# Patient Record
Sex: Female | Born: 1995 | Race: White | Hispanic: No | Marital: Married | Smoking: Never smoker
Health system: Southern US, Community
[De-identification: ages and names within clinical notes are randomized; demographics above are authoritative.]

---

## 2018-02-25 ENCOUNTER — Encounter: Payer: Self-pay | Admitting: Family Medicine

## 2018-02-25 ENCOUNTER — Ambulatory Visit
Admission: RE | Admit: 2018-02-25 | Discharge: 2018-02-25 | Disposition: A | Discharge status: Home / Self Care | Payer: No Typology Code available for payment source | Source: Ambulatory Visit | Attending: Family Medicine | Admitting: Family Medicine

## 2018-02-25 ENCOUNTER — Ambulatory Visit: Payer: Self-pay | Admitting: Family Medicine

## 2018-02-25 ENCOUNTER — Other Ambulatory Visit: Payer: Self-pay

## 2018-02-25 VITALS — BP 110/62 | HR 82 | Temp 98.3°F | Ht 69.0 in | Wt 110.8 lb

## 2018-02-25 DIAGNOSIS — Y9315 Activity, underwater diving and snorkeling: Secondary | ICD-10-CM

## 2018-02-25 DIAGNOSIS — Z0000 Encounter for general adult medical examination without abnormal findings: Secondary | ICD-10-CM

## 2018-02-25 LAB — POCT HEMOGLOBIN: Hemoglobin: 13.1 g/dL (ref 12.2–16.2)

## 2018-02-25 LAB — POCT UA - GLUCOSE/PROTEIN
Glucose, UA: NEGATIVE
Protein, UA: NEGATIVE

## 2018-02-25 LAB — GLUCOSE, POCT (MANUAL RESULT ENTRY): POC Glucose: 84 mg/dl (ref 70–99)

## 2018-02-25 NOTE — Progress Notes (Signed)
S:    Patient arrives in good spirits without complaints.    Presents for lung function evaluation for "dive physical". Patient reports breathing has been stable and without complaint.   Denies history or breathing difficulty.   O: Patient provided good effort while attempting spirometry.  FVC 4.87    Calculated Lower Limit for NOAA Diving Standards   FVC = 3.62 FEV1 3.96       Calculated Lower Limit for NOAA Diving Standards   FEV1= 3.09 FEV1/FVC  81.3   Calculated Lower Limit for NOAA Diving Standards   FEV1/FVC = 75.9   See "scanned report" or Documentation Flowsheet (discrete results - PFTs) for  Spirometry results and copy of evaluation.   A/P: Spirometry evaluation without bronchodilator reveals normal lung function.  FEV1, FVC and FEV1/FVC ratio all exceed threshold for spirometric parameters.   Reviewed results of pulmonary function tests.  Pt verbalized understanding of results and education.

## 2018-02-25 NOTE — Assessment & Plan Note (Signed)
Vision (distance, near, color), hearing, UA, CBG, Hgb, and Spirometry all within normal limits. Normal CXR obtained today.  EKG:  N/a due to age Coronary assessment:  N/a due to age Approval for SCUBA diving, I find no medical conditions considered incompatible with diving. Due to age and lack of medical conditions, Faith SagoSarah qualifies for 2 year certification.

## 2018-02-25 NOTE — Patient Instructions (Signed)
It was good to meet you today.  Go for your chest xray.    By the time you get back here, it will have been read and you can pick up your paperwork up front if everything looks good.

## 2018-02-25 NOTE — Progress Notes (Signed)
Subjective:    Faith AhmadiSarah Segall is a 22 y.o. female who presents to [facility] today for scuba diving physical for the [facility]:  1.  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  Denies any complications or injuries while diving.  Has never failed a fitness to dive physical.  Currently well, without complaints.  No injuries/issues during any prior dives.  The following portions of the patient's history were reviewed and updated as appropriate: allergies, current medications, past medical history, family and social history, and problem list.  PMH reviewed.  No past medical history on file.  Medications reviewed. No current outpatient medications on file.   No current facility-administered medications for this visit.     PMH:   - No medical conditions. - No other hospitalizations or other prior medical history   PSH: - no history of surgeries in past.   Family History: - Family history of diabetes and high cholesterol; otherwise denies.  No pulmonary issues.    Social: - From [place] .  Moved here to [place] for work [employer] - Never smoker - Denies illicit drug use - Very occasional social drinker (1-2 drinks)  SCUBA ROS:  She denies any history of middle ear trauma/disease, vertigo, ocular surgery, asthma or other respiratory issues, seizures, loss of consciousness, recurring neurologic disorders, history of head injury, coagulopathies, evidence of CAD or other structural heart disease, pneumothorax, diabetes, or exercise intolerance.    General ROS:  The patient also denies fever, unusual weight change, decreased hearing, chest pain, palpitations, pre-syncopal or syncopal episodes, dyspnea on exertion, prolonged cough, hemoptysis, change in bowel habits, melena, hematochezia, severe indigestion/heartburn, nausea/vomiting/abdominal pain, genital sores, muscle weakness, difficulty walking, abnormal bleeding,  or enlarged lymph nodes.     Objective:   Physical Exam BP 110/62   Pulse 82   Temp 98.3 F (36.8 C) (Oral)   Ht 5\' 9"  (1.753 m)   Wt 110 lb 12.8 oz (50.3 kg)   LMP 02/10/2018   SpO2 98%   BMI 16.36 kg/m  Gen:  Alert, cooperative patient who appears stated age in no acute distress.  Vital signs reviewed. Head:  [place]/AT Eyes:  Fundoscopy WNL BL.  PERRL, EOMI Ears:  External ears WNL, Bilateral TM's normal without retraction, redness or bulging.  Canals clear BL  Mouth:  Good dental hygiene. Tonsils non-erythematous, non-edematous.   MMM Neck:  Trachea midline Cardiac:  Regular rate and rhythm without murmur auscultated.   Pulm:  Clear to auscultation bilaterally with good air movement throuhout.  No wheezes or rales noted.   Abd:  Soft/nondistended/nontender.  Good bowel sounds throughout all four quadrants.  No masses noted.  Exts: No edema BL LE's, warm and well-perfused Neuro:  Alert and oriented to person, place, and date.  CN II-XII intact.  Sensation intact to light touch and vibration bilateral upper and lower extremities equally.  Motor function equal and strength 5/5 bilateral upper and lower extremities.  Normal gait.  DTRs +2 BL tricep, brachialis, patellar, and achilles.  Finger to nose cerebellar testing within normal limits.  Color vision testing normal. Psych:  Not depressed or anxious appearing.  Linear and coherent thought process as evidenced by speech pattern. Smiles spontaneously.   No results found for this or any previous visit (from the past 72 hour(s)).

## 2018-09-21 ENCOUNTER — Encounter: Payer: Self-pay | Admitting: Physical Therapy

## 2018-09-21 ENCOUNTER — Other Ambulatory Visit: Payer: Self-pay

## 2018-09-21 ENCOUNTER — Ambulatory Visit: Payer: BLUE CROSS/BLUE SHIELD | Attending: Obstetrics and Gynecology | Admitting: Physical Therapy

## 2018-09-21 DIAGNOSIS — R278 Other lack of coordination: Secondary | ICD-10-CM | POA: Diagnosis present

## 2018-09-21 DIAGNOSIS — R252 Cramp and spasm: Secondary | ICD-10-CM | POA: Diagnosis not present

## 2018-09-21 NOTE — Patient Instructions (Signed)
Guided Meditation for Pelvic Floor Relaxation  [employer]  Pelvic Floor Release Stretches (NEW)  [employer]  Pelvic Floor Release Stretches  [employer]   The "Pelvic Drop" to Release Pelvic Floor Tension: Three Visualizations    Vaginismus.[facility] [address] [place] [address] [place], [place] [address] Phone # [phone] Fax [phone]

## 2018-09-21 NOTE — Therapy (Signed)
[facility] [address], [address] [place], [place], [address] Phone: [phone]   Fax:  [phone]  Physical Therapy Evaluation  Patient Details  Name: Faith AhmadiSarah Mendez MRN: [id number] Date of Birth: 1995/12/01 Referring Provider (PT): Dr. Evans LanceJacqueline Wong   Encounter Date: 09/21/2018  PT End of Session - 09/21/18 2232    Visit Number  1    Date for PT Re-Evaluation  01/20/19    PT Start Time  1625    PT Stop Time  1710    PT Time Calculation (min)  45 min    Activity Tolerance  Patient tolerated treatment well;No increased pain    Behavior During Therapy  [facility] for tasks assessed/performed       History reviewed. No pertinent past medical history.  History reviewed. No pertinent surgical history.  There were no vitals filed for this visit.   Subjective Assessment - 09/21/18 1628    Subjective  Patient reports pelvic pain with intercourse. Patietn reports the muscles are tight. Patient feels like the penis is hitting a wall. The pain will linger for several hours. Mentally prepares herself to use a tampon to relax the muscles.     Patient is accompained by:  Family member   spouse   Patient Stated Goals  Not have pain with intercourse    Currently in Pain?  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 Evans LanceJacqueline Wong    Prior Therapy  None      Precautions   Precautions  None      Restrictions   Weight  Bearing Restrictions  No      Balance Screen   Has the patient fallen in the past 6 months  No    Has the patient had a decrease in activity level because of a fear of falling?   No    Is the patient reluctant to leave their home because of a fear of falling?   No      Home Public house managernvironment   Living Environment  Private residence      Prior Function   Level of Independence  Independent    Vocation  Full time employment    Vocation Requirements  stand, lifting, climbing laters, lift 60# boxes, frozen fish    Leisure  used run cross country       Cognition   Overall Cognitive Status  Within Functional Limits for tasks assessed      Posture/Postural Control   Posture/Postural Control  Postural limitations    Postural Limitations  Rounded Shoulders;Forward head;Flexed trunk      ROM / Strength   AROM / PROM / Strength  AROM;PROM;Strength      AROM   Lumbar Extension  decreased by 25%  Strength   Overall Strength  Within functional limits for tasks performed      Right Hip   Right Hip Flexion  110   left 130               Objective measurements completed on examination: See above findings.    Pelvic Floor Special Questions - 09/21/18 0001    Prior Pregnancies  No    Currently Sexually Active  Yes    Is this Painful  Yes    Marinoff Scale  pain prevents any attempts at intercourse    Urinary Leakage  No    Urinary urgency  No    Fecal incontinence  No   no constipation   Skin Integrity  Intact    Perineal Body/Introitus   Elevated    External Palpation  Qtip palpation is tender superior vulva, left side of introitus    Prolapse  None    Pelvic Floor Internal Exam  Patient confirms identification and approves PT to confirms identication and approves PT to assess the pelvic floor and treatment    Exam Type  Vaginal    Palpation  tenderness located on the perineal body, bil. obturator internist, bil. levator ani, sides of the urethra, bil. urethera sphincter     Strength  weak squeeze, no lift    Strength # of seconds  2    Tone  hypertonic               PT Education - 09/21/18 2230    Education Details  information on you tube videos for pelvic floor stretches, pelvic floor mediatation, information on dilators, how to use q tip to desentize the vulva area and be used to the qtip in the vaginal canal    Person(s) Educated  Patient;Spouse    Methods  Explanation;Handout    Comprehension  Verbalized understanding       PT Short Term Goals - 09/21/18 2251      PT SHORT TERM GOAL #1   Title  independent with initial HEP    Time  4    Period  Weeks    Status  New    Target Date  10/19/18      PT SHORT TERM GOAL #2   Title  abilty to start using dilators due to improved tissue mobility    Time  4    Period  Weeks    Status  New    Target Date  10/19/18      PT SHORT TERM GOAL #3   Title  ability to relax the pelvic floor using the drop method    Time  4    Period  Weeks    Status  New    Target Date  10/19/18      PT SHORT TERM GOAL #4   Title  abilty to demonstrate correct posture due to improved back strength and lengthening of the front trunk muscles    Time  4    Period  Weeks    Status  New    Target Date  10/19/18        PT Long Term Goals - 09/21/18 2253      PT LONG TERM GOAL #1   Title  independent with HEP and understand how to progress    Time  4    Period  Months    Status  New    Target Date  01/20/19      PT LONG TERM  GOAL #2   Title  ability to have the penis penetrate the vaginal canal with pain decreased >/= 80% with Marinoff score 1/3    Time  4    Period  Months    Target Date  01/20/19      PT LONG TERM GOAL #3   Title  abilty to have a vaginal exam without crying due to pain decreased >/= 80% due to improved elasticity of the vaginal canal    Time  4    Period  Months    Status  New    Target Date  01/20/19      PT LONG TERM GOAL #4   Title  abilty to fully move the perineal body  through the full excursion due to improved tissue mobility    Time  4    Period  Months    Status  New    Target Date  01/20/19      PT LONG TERM GOAL #5   Title  understand how to fully relax the pelvic floor muscles with penile penetration    Time  4    Period  Months    Status  New    Target Date  01/20/19             Plan - 09/21/18 2233    Clinical Impression Statement  Patient is a 23 year old female with high-tone pelvic floor dysfunction for the past 4 years. Patient reports she has pain with intercourse and feels like the penis is hitting a brick wall.  Patient reports his pain level is 8/10  with penile penetration and vaginal exams. Patient is using Estradiol to the vulval area.  Patient perineal body is elevated. Patient has tenderness with a qtip on the sides of the urethra, and left side of the vulva. Patient has tenderness located on the perineal body, bilateral obturator internist, bilateral levator ani, bilateral urethra sphincter, and sides of the bladder. Pelvic floor strength is 2/5.  Patient posture consists of flexed posture, forward head and rounded shoulders in sitting and standing. Lumbar extension decreased by 25%. Right hip flexion is 110 degrees and left hip flexion is 130 degrees passively. Patient husband was present during the evaluation and was very supportive. Patient will benefit from skilled therapy to reduce fascial tension, improve tissue mobility, and reduce pain with penile penetration.     History and Personal Factors relevant to plan of care:  none     Clinical Presentation  Stable    Clinical Presentation due to:  stable condition    Clinical Decision Making  Low    Rehab Potential  Excellent    Clinical Impairments Affecting Rehab Potential  none    PT Frequency  1x / week    PT Duration  Other (comment)   4 months   PT Treatment/Interventions  Biofeedback;Moist Heat;Ultrasound;Neuromuscular re-education;Therapeutic exercise;Therapeutic  activities;Patient/family education;Manual techniques;Dry needling;Passive range of motion    PT Next Visit Plan  myofascial release of the pelvic floor, abdominal massage , hip adductor release, go over stretches    Consulted and Agree with Plan of Care  Patient;Family member/caregiver    Family Member Consulted  spouse       Patient will benefit from skilled therapeutic intervention in order to improve the following deficits and impairments:  Increased fascial restricitons, Pain, Decreased coordination, Decreased mobility, Increased muscle spasms, Impaired tone, Decreased activity tolerance, Decreased endurance, Decreased range of motion, Decreased strength, Hypomobility, Impaired flexibility  Visit Diagnosis:  Cramp and spasm  Other lack of coordination     Problem List Patient Active Problem List   Diagnosis Date Noted  . Activities involving scuba diving 02/25/2018    Eulis Foster, PT 09/21/18 11:02 PM   [facility] [address], [address] [place], [place], [address] Phone: [phone]   Fax:  [phone]  Name: Faith Mendez MRN: [id number] Date of Birth: [birth year]

## 2018-09-28 ENCOUNTER — Ambulatory Visit: Payer: BLUE CROSS/BLUE SHIELD | Admitting: Physical Therapy

## 2018-09-28 ENCOUNTER — Encounter: Payer: Self-pay | Admitting: Physical Therapy

## 2018-09-28 DIAGNOSIS — R252 Cramp and spasm: Secondary | ICD-10-CM | POA: Diagnosis not present

## 2018-09-28 DIAGNOSIS — R278 Other lack of coordination: Secondary | ICD-10-CM

## 2018-09-28 NOTE — Therapy (Signed)
[facility] [address], [address] [place], [place], [address] Phone: [phone]   Fax:  [phone]  Physical Therapy Treatment  Patient Details  Name: Faith AhmadiSarah Mendez MRN: [id number] Date of Birth: 10/11/1995 Referring Provider (PT): Dr. Evans LanceJacqueline Wong   Encounter Date: 09/28/2018  PT End of Session - 09/28/18 1316    Visit Number  2    Date for PT Re-Evaluation  01/20/19    Authorization Type  BC[employer]    PT Start Time  1230    PT Stop Time  1310    PT Time Calculation (min)  40 min    Activity Tolerance  Patient tolerated treatment well;No increased pain    Behavior During Therapy  [facility] for tasks assessed/performed       History reviewed. No pertinent past medical history.  History reviewed. No pertinent surgical history.  There were no vitals filed for this visit.  Subjective Assessment - 09/28/18 1233    Subjective  I have been trying to get my stretches in.     Patient Stated Goals  Not have pain with intercourse    Currently in Pain?  Yes    Pain Score  8     Pain Location  Vagina    Pain Orientation  Mid    Pain Descriptors / Indicators  Burning;Sharp    Pain Type  Chronic pain    Pain Onset  More than a month ago    Pain Frequency  Intermittent    Aggravating Factors   penile penetration, vaginal exams are excrutiating with screaming    Pain Relieving Factors  no penetration of the vaginal canal    Multiple Pain Sites  No                       [facility] Adult PT Treatment/Exercise - 09/28/18 0001      Neuro Re-ed    Neuro Re-ed Details   diaphragmatic breathing with opening up the lower rib cage and transfer the breath to the pelvic floor to expand the pelvic floor using tactile cues      Manual Therapy   Manual Therapy  Soft tissue mobilization;Joint mobilization;Myofascial release    Joint Mobilization  rib mobilization to lower rib cage to open up the rib cage    Soft tissue mobilization   abdominal massage to open up the the abdominal wall; diaphgram, rectus abdominus; bil. hi padductor    Myofascial Release  release around the perineum, along the bulbocavernosus and ischicavernosus       Trigger Point Dry Needling - 09/28/18 1250    Consent Given?  Yes    Education Handout Provided  Yes    Muscles Treated Lower Body  Adductor longus/brevius/maximus    Adductor Response  Twitch response elicited;Palpable increased muscle length           PT Education - 09/28/18 1310    Education Details  information on dry needling    Person(s) Educated  Patient    Methods  Explanation;Handout    Comprehension  Verbalized understanding       PT Short Term Goals - 09/21/18 2251      PT SHORT TERM GOAL #1   Title  independent with initial HEP    Time  4    Period  Weeks    Status  New    Target Date  10/19/18      PT SHORT TERM GOAL #2   Title  abilty to start using dilators due to improved tissue mobility    Time  4    Period  Weeks    Status  New    Target Date  10/19/18      PT SHORT TERM GOAL #3   Title  ability to relax the pelvic floor using the drop method    Time  4    Period  Weeks    Status  New    Target Date  10/19/18      PT SHORT TERM GOAL #4   Title  abilty to demonstrate correct posture due to improved back strength and lengthening of the front trunk muscles    Time  4    Period  Weeks    Status  New    Target Date  10/19/18        PT Long Term Goals - 09/21/18 2253      PT LONG TERM GOAL #1   Title  independent with HEP and understand how to progress    Time  4    Period  Months    Status  New    Target Date  01/20/19      PT LONG TERM GOAL #2   Title  ability to have the penis penetrate the vaginal canal with pain decreased >/= 80% with Marinoff score 1/3    Time  4    Period  Months    Target Date  01/20/19      PT LONG TERM GOAL #3   Title  abilty to have a vaginal exam without crying due to pain decreased >/= 80% due to  improved elasticity of the vaginal canal    Time  4    Period  Months    Status  New    Target Date  01/20/19      PT LONG TERM GOAL #4   Title  abilty to fully move the perineal body through the full excursion due to improved tissue mobility    Time  4    Period  Months    Status  New    Target Date  01/20/19      PT LONG TERM GOAL #5   Title  understand how to fully relax the pelvic floor muscles with penile penetration    Time  4    Period  Months    Status  New    Target Date  01/20/19            Plan - 09/28/18 1317    Clinical Impression Statement  Patient is being consistent with her HEP. Patient had several muscle twitches in the hip adductors. Patient muscle relaxed around the pelvic floor with myofascial release. Patient used a Ship brokermirror to watch the dry needling to make her relax. Patient has the dilators and will bring them next time. Patient is using the pelvic floor meditation. Patient will benefit from skilled therapy to reduce fascial tension, improve tissue mobiltiy, and reduce pain with penile penetration.     Rehab Potential  Excellent    Clinical Impairments Affecting Rehab Potential  none    PT Frequency  1x / week    PT Duration  Other (comment)   4 months   PT Treatment/Interventions  Biofeedback;Moist Heat;Ultrasound;Neuromuscular re-education;Therapeutic exercise;Therapeutic activities;Patient/family education;Manual techniques;Dry needling;Passive range of motion    PT Next Visit Plan  myofascial release of the pelvic floor, abdominal massage , check ilial alignment       Patient will  benefit from skilled therapeutic intervention in order to improve the following deficits and impairments:  Increased fascial restricitons, Pain, Decreased coordination, Decreased mobility, Increased muscle spasms, Impaired tone, Decreased activity tolerance, Decreased endurance, Decreased range of motion, Decreased strength, Hypomobility, Impaired flexibility  Visit  Diagnosis: Cramp and spasm  Other lack of coordination     Problem List Patient Active Problem List   Diagnosis Date Noted  . Activities involving scuba diving 02/25/2018    Eulis Foster, PT 09/28/18 1:26 PM   [facility] [address] [place], [place], [address] Phone: [phone]   Fax:  [phone]  Name: Faith Mendez MRN: [id number] Date of Birth: 1995-12-21

## 2018-09-28 NOTE — Patient Instructions (Signed)

## 2018-10-10 ENCOUNTER — Encounter: Payer: Self-pay | Admitting: Physical Therapy

## 2018-10-10 ENCOUNTER — Ambulatory Visit: Payer: BLUE CROSS/BLUE SHIELD | Attending: Obstetrics and Gynecology | Admitting: Physical Therapy

## 2018-10-10 DIAGNOSIS — R252 Cramp and spasm: Secondary | ICD-10-CM | POA: Diagnosis present

## 2018-10-10 DIAGNOSIS — R278 Other lack of coordination: Secondary | ICD-10-CM

## 2018-10-10 NOTE — Therapy (Signed)
[facility] [address], [address] [place], [place], [address] Phone: [phone]   Fax:  [phone]  Physical Therapy Treatment  Patient Details  Name: Faith Mendez MRN: [id number] Date of Birth: 1996/01/09 Referring Provider (PT): Dr. Evans Lance   Encounter Date: 10/10/2018  PT End of Session - 10/10/18 1618    Visit Number  3    Date for PT Re-Evaluation  01/20/19    Authorization Type  BCBS    PT Start Time  1615    PT Stop Time  1655    PT Time Calculation (min)  40 min    Activity Tolerance  Patient tolerated treatment well;No increased pain    Behavior During Therapy  [facility] for tasks assessed/performed       History reviewed. No pertinent past medical history.  History reviewed. No pertinent surgical history.  There were no vitals filed for this visit.  Subjective Assessment - 10/10/18 1613    Subjective  When I use the q-tip there is no pain. The last couple of days trouble relaxing. I noticed after the dry needling the area stayed relaxed longer. I have the dilators. Areas with irritation is by the top and the inner labiaI see Dr. Modesto Charon in 1 month.     Patient is accompained by:  Family member   husband   Patient Stated Goals  Not have pain with intercourse    Currently in Pain?  Yes    Pain Score  8     Pain Location  Vagina    Pain Orientation  Mid    Pain Descriptors / Indicators  Burning;Sharp    Pain Type  Chronic pain    Pain Onset  More than a month ago    Pain Frequency  Intermittent    Aggravating Factors   penile penetration, vaginal exams are exrutiating with screaming    Pain Relieving Factors  no penetration of the vaginal canal    Multiple Pain Sites  No                    Pelvic Floor Special Questions - 10/10/18 0001    Pelvic Floor Internal Exam  Patient confirms identification and approves PT to confirms identication and approves PT to assess the pelvic floor and  treatment    Exam Type  Vaginal        [facility] Adult PT Treatment/Exercise - 10/10/18 0001      Self-Care   Self-Care  Other Self-Care Comments    Heat/Ice Application  instructed patien ton lhow to use the dilator, how to move the hips with the dilator, how to release the trigger points using the dilator, how to progress the dilator      Manual Therapy   Manual Therapy  Soft tissue mobilization;Internal Pelvic Floor    Soft tissue mobilization  perineal body, sides of introitus    Internal Pelvic Floor  along the introitus, levator ani       Trigger Point Dry Needling - 10/10/18 1645    Consent Given?  Yes    Muscles Treated Upper Body  --   perineal body   Muscles Treated Lower Body  Adductor longus/brevius/maximus   left   Adductor Response  Twitch response elicited;Palpable increased muscle length           PT Education - 10/10/18 1658    Education Details  how to use the dilator, information on vaginal lubricant; gave patient lubricant samples  Person(s) Educated  Patient    Methods  Explanation;Demonstration;Verbal cues;Handout    Comprehension  Returned demonstration;Verbalized understanding       PT Short Term Goals - 10/10/18 1703      PT SHORT TERM GOAL #1   Title  independent with initial HEP    Time  4    Period  Weeks    Status  Achieved    Target Date  10/19/18      PT SHORT TERM GOAL #2   Title  abilty to start using dilators due to improved tissue mobility    Time  4    Period  Weeks    Status  Achieved      PT SHORT TERM GOAL #3   Title  ability to relax the pelvic floor using the drop method    Time  4    Period  Weeks    Status  On-going      PT SHORT TERM GOAL #4   Title  abilty to demonstrate correct posture due to improved back strength and lengthening of the front trunk muscles    Time  4    Period  Weeks    Status  On-going    Target Date  10/19/18        PT Long Term Goals - 09/21/18 2253      PT LONG TERM GOAL #1    Title  independent with HEP and understand how to progress    Time  4    Period  Months    Status  New    Target Date  01/20/19      PT LONG TERM GOAL #2   Title  ability to have the penis penetrate the vaginal canal with pain decreased >/= 80% with Marinoff score 1/3    Time  4    Period  Months    Target Date  01/20/19      PT LONG TERM GOAL #3   Title  abilty to have a vaginal exam without crying due to pain decreased >/= 80% due to improved elasticity of the vaginal canal    Time  4    Period  Months    Status  New    Target Date  01/20/19      PT LONG TERM GOAL #4   Title  abilty to fully move the perineal body through the full excursion due to improved tissue mobility    Time  4    Period  Months    Status  New    Target Date  01/20/19      PT LONG TERM GOAL #5   Title  understand how to fully relax the pelvic floor muscles with penile penetration    Time  4    Period  Months    Status  New    Target Date  01/20/19            Plan - 10/10/18 1618    Clinical Impression Statement  Patient understands how to use the dilator and progress herself. Patient understands how to use the dilator to release trigger points. Patient perineal body was relaxed after the dry needling. Patient pelvis in correct alignment. Patient will benefit from skilled therapy to reduce fascial tension, improve tissue mobility and reduce pain with penile penetration.     Rehab Potential  Excellent    Clinical Impairments Affecting Rehab Potential  none    PT Frequency  1x / week  PT Duration  Other (comment)   4 months   PT Treatment/Interventions  Biofeedback;Moist Heat;Ultrasound;Neuromuscular re-education;Therapeutic exercise;Therapeutic activities;Patient/family education;Manual techniques;Dry needling;Passive range of motion    PT Next Visit Plan  myofascial release of the pelvic floor, abdominal massage , check on dilator    Consulted and Agree with Plan of Care  Patient    Family  Member Consulted  spouse       Patient will benefit from skilled therapeutic intervention in order to improve the following deficits and impairments:  Increased fascial restricitons, Pain, Decreased coordination, Decreased mobility, Increased muscle spasms, Impaired tone, Decreased activity tolerance, Decreased endurance, Decreased range of motion, Decreased strength, Hypomobility, Impaired flexibility  Visit Diagnosis: Cramp and spasm  Other lack of coordination     Problem List Patient Active Problem List   Diagnosis Date Noted  . Activities involving scuba diving 02/25/2018    Eulis Fosterheryl Shatoria [place], PT 10/10/18 5:04 PM   [facility] [address], [address] [place], [place], [address] Phone: [phone]   Fax:  [phone]  Name: Faith AhmadiSarah Mendez MRN: [id number] Date of Birth: May 30, 1996

## 2018-10-10 NOTE — Patient Instructions (Signed)
  Lubrication . Used for intercourse to reduce friction . Avoid ones that have glycerin, warming gels, tingling gels, icing or cooling gel, scented . Avoid parabens due to a preservative similar to female sex hormone . May need to be reapplied once or several times during sexual activity . Can be applied to both partners genitals prior to vaginal penetration to minimize friction or irritation . Prevent irritation and mucosal tears that cause post coital pain and increased the risk of vaginal and urinary tract infections . Oil-based lubricants cannot be used with condoms due to breaking them down.  Least likely to irritate vaginal tissue.  . Plant based-lubes are safe . Silicone-based lubrication are thicker and last long and used for post-menopausal women  Vaginal Lubricators Here is a list of some suggested lubricators you can use for intercourse. Use the most hypoallergenic product.  You can place on you or your partner.   Slippery Stuff  Sylk or Sliquid Natural H2O ( good  if frequent UTI's)  Blossom Organics (www.blossom-organics.com)  Luvena   Coconut oil  PJur Woman Nude- water based lubricant, amazon  Uberlube- Amazon  Aloe Vera  Yes lubricant- [employer] Platinum-Silicone, Target, [facility]  Olive and Bee intimate cream-  www.oliveandbee.com.au  Pink - Amazon Things to avoid in lubricants are glycerin, warming gels, tingling gels, icing or cooling  gels, and scented gels.  Also avoid Vaseline. KY jelly, Replens, and Astroglide kills good bacteria(lactobacilli)  Things to avoid in the vaginal area . Do not use things to irritate the vulvar area . No lotions- see below . No soaps; can use Aveeno or Calendula cleanser if needed. Must be gentle . No deodorants . No douches . Good to sleep without underwear to let the vaginal area to air out . No scrubbing: spread the lips to let warm water rinse over labias and pat dry  Creams that can be used on the Vulva  Area  [employer]  Vital V Wild Yam Salve  Julva- [employer] Pro-Meno Wild Yam Cream  Desert Havest Releveum or Desert Harvest Gele     PROTOCOL FOR DILATORS   1. Wash dilator with soap and water prior to insertion.    2. Lay on your back reclined. Knees are to be up and apart while on your bed or in the bathtub with warm water.   3. Lubricate the end of the dilator with a water-soluble lubricant.  4. Separate the labia.   5. Tense the pelvic floor muscles than relax; while relaxing, slide lubricated dilator into the vagina.    6. Tense muscles again while holding the dilator so it does not get pushed out; relax and slide it in a little further.   7. Try blowing out as if filling a balloon; this may relax the muscles and allow penetration.  Repeat blowing out to insert dilator further.  8. Keep dilator in for 10 minutes if tolerate, with the pelvic floor muscles relaxed to further stretch the canal.   9. Never force the dilator into the canal.  10. 1-2 times per day [facility] [address] [place], [place] [address] Phone # [phone] Fax [phone]

## 2018-10-24 ENCOUNTER — Encounter: Payer: Self-pay | Admitting: Physical Therapy

## 2018-10-24 ENCOUNTER — Ambulatory Visit: Payer: BLUE CROSS/BLUE SHIELD | Admitting: Physical Therapy

## 2018-10-24 DIAGNOSIS — R278 Other lack of coordination: Secondary | ICD-10-CM

## 2018-10-24 DIAGNOSIS — R252 Cramp and spasm: Secondary | ICD-10-CM

## 2018-10-24 NOTE — Therapy (Signed)
[facility] [address] [place], [place], [address] Phone: [phone]   Fax:  [phone]  Physical Therapy Treatment  Patient Details  Name: Faith Mendez MRN: [id number] Date of Birth: 19-Jul-1996 Referring Provider (PT): Dr. Evans Lance   Encounter Date: 10/24/2018  PT End of Session - 10/24/18 1709    Visit Number  4    Date for PT Re-Evaluation  01/20/19    Authorization Type  BCBS    PT Start Time  1615    PT Stop Time  1700    PT Time Calculation (min)  45 min    Activity Tolerance  Patient tolerated treatment well;No increased pain    Behavior During Therapy  [facility] for tasks assessed/performed       History reviewed. No pertinent past medical history.  History reviewed. No pertinent surgical history.  There were no vitals filed for this visit.  Subjective Assessment - 10/24/18 1619    Subjective  I am on the second dilator. I am prgressing well. I see Dr. Modesto Charon next Monday. Pain level with dilator 2-3/10. Most of the time the pain level is 0/10. Patient still has external pain  along the left lower urethra that is 3/10.  Patient reports a bump on the right lower vestibule by the [facility] gland.     Patient Stated Goals  Not have pain with intercourse    Currently in Pain?  Yes    Pain Score  8     Pain Location  Vagina    Pain Orientation  Mid    Pain Descriptors / Indicators  Burning;Sharp    Pain Type  Chronic pain    Pain Onset  More than a month ago    Pain Frequency  Intermittent    Aggravating Factors   penile penetration, vaginal exams     Pain Relieving Factors  no penetration of the vaginal canal    Multiple Pain Sites  No                    Pelvic Floor Special Questions - 10/24/18 0001    Biofeedback  attempted the biofeedback but the sensors would not stick to the perineal area, tried cleaning the area but still would not stick    Biofeedback sensor type  Surface    vaginal       [facility] Adult PT Treatment/Exercise - 10/24/18 0001      Neuro Re-ed    Neuro Re-ed Details   pelvic floor drops in front of mirror to relax the pelvic floor and have the muscles move through the excursion      Manual Therapy   Manual Therapy  Internal Pelvic Floor    Manual therapy comments  educated patient on using the dilators to massage the muscles and work on pelvic floor trigger points    Soft tissue mobilization  to bilateral thighs to show patients husband on how to perform massage to the legs    Internal Pelvic Floor  along the introitus, levator ani, and obturator internist             PT Education - 10/24/18 1707    Education Details  educated patient on how to move the dilator on trigger points and hold 90 seconds, massage the pelvic floor muscles with dilator, instructed spouse on thigh massage    Person(s) Educated  Patient;Spouse    Methods  Explanation;Demonstration    Comprehension  Verbalized understanding;Returned demonstration  PT Short Term Goals - 10/24/18 1627      PT SHORT TERM GOAL #4   Title  abilty to demonstrate correct posture due to improved back strength and lengthening of the front trunk muscles    Baseline  still giving patient verbal cues on her posture    Time  4    Period  Weeks    Status  On-going        PT Long Term Goals - 09/21/18 2253      PT LONG TERM GOAL #1   Title  independent with HEP and understand how to progress    Time  4    Period  Months    Status  New    Target Date  01/20/19      PT LONG TERM GOAL #2   Title  ability to have the penis penetrate the vaginal canal with pain decreased >/= 80% with Marinoff score 1/3    Time  4    Period  Months    Target Date  01/20/19      PT LONG TERM GOAL #3   Title  abilty to have a vaginal exam without crying due to pain decreased >/= 80% due to improved elasticity of the vaginal canal    Time  4    Period  Months    Status  New    Target Date   01/20/19      PT LONG TERM GOAL #4   Title  abilty to fully move the perineal body through the full excursion due to improved tissue mobility    Time  4    Period  Months    Status  New    Target Date  01/20/19      PT LONG TERM GOAL #5   Title  understand how to fully relax the pelvic floor muscles with penile penetration    Time  4    Period  Months    Status  New    Target Date  01/20/19            Plan - 10/24/18 1709    Clinical Impression Statement  Patient is on the second dilator. Patient is able to bulge the pelvic floor to perform a pelvic drop. Patient understands how to use the dilator to work on pelvic floor trigger points. Patient husband understands how he can assist in patient in muscle relaxation. Patient tolerates therapist to perform pelvic floor soft tissue work and right side is softer than left. Patient will benefit from skilled therapy to reduce fascial tension, imporve tissue mobility and reduce pain with penile penetration.     Rehab Potential  Excellent    Clinical Impairments Affecting Rehab Potential  none    PT Frequency  1x / week    PT Duration  Other (comment)   4 months   PT Treatment/Interventions  Biofeedback;Moist Heat;Ultrasound;Neuromuscular re-education;Therapeutic exercise;Therapeutic activities;Patient/family education;Manual techniques;Dry needling;Passive range of motion    PT Next Visit Plan  myofascial release of the pelvic floor internally, abdominal massage , check on dilator    Consulted and Agree with Plan of Care  Patient    Family Member Consulted  spouse       Patient will benefit from skilled therapeutic intervention in order to improve the following deficits and impairments:  Increased fascial restricitons, Pain, Decreased coordination, Decreased mobility, Increased muscle spasms, Impaired tone, Decreased activity tolerance, Decreased endurance, Decreased range of motion, Decreased strength, Hypomobility, Impaired  flexibility  Visit Diagnosis: Cramp and spasm  Other lack of coordination     Problem List Patient Active Problem List   Diagnosis Date Noted  . Activities involving scuba diving 02/25/2018    Eulis Foster, PT 10/24/18 5:13 PM ' [facility] [address] [place], [place], [address] Phone: [phone]   Fax:  [phone]  Name: Faith Mendez MRN: [id number] Date of Birth: 10-10-1995

## 2018-11-14 ENCOUNTER — Encounter: Payer: No Typology Code available for payment source | Admitting: Physical Therapy

## 2018-11-21 ENCOUNTER — Telehealth: Payer: Self-pay | Admitting: Physical Therapy

## 2018-11-21 NOTE — Telephone Encounter (Signed)
Called patient and spoke to her about progressing her dilator and using her muscle relaxer. Patient understands how to progress the dilator and continue with the medication the doctor prescribed as directed. Patient will email therapist if needed or see her on 12/05/2018,  Eulis Foster, PT @3 /23/2020@ 9:47 AM

## 2018-11-28 ENCOUNTER — Encounter: Payer: No Typology Code available for payment source | Admitting: Physical Therapy

## 2018-12-05 ENCOUNTER — Encounter: Payer: No Typology Code available for payment source | Admitting: Physical Therapy

## 2019-01-30 ENCOUNTER — Encounter: Payer: Self-pay | Admitting: Physical Therapy

## 2019-01-30 ENCOUNTER — Ambulatory Visit: Payer: BC Managed Care – PPO | Attending: Obstetrics and Gynecology | Admitting: Physical Therapy

## 2019-01-30 ENCOUNTER — Other Ambulatory Visit: Payer: Self-pay

## 2019-01-30 DIAGNOSIS — R252 Cramp and spasm: Secondary | ICD-10-CM | POA: Diagnosis not present

## 2019-01-30 DIAGNOSIS — R278 Other lack of coordination: Secondary | ICD-10-CM | POA: Diagnosis present

## 2019-01-30 NOTE — Therapy (Signed)
[facility] [address], [address] [place], [place], [address] Phone: [phone]   Fax:  [phone]  Physical Therapy Treatment  Patient Details  Name: Faith AhmadiSarah Mendez MRN: [id number] Date of Birth: 1996/05/19 Referring Provider (PT): Dr. Evans LanceJacqueline Wong   Encounter Date: 01/30/2019  PT End of Session - 01/30/19 1005    Visit Number  5    Date for PT Re-Evaluation  04/24/19    Authorization Type  BCBS    PT Start Time  1000    PT Stop Time  1050    PT Time Calculation (min)  50 min    Activity Tolerance  Patient tolerated treatment well;No increased pain    Behavior During Therapy  [facility] for tasks assessed/performed       History reviewed. No pertinent past medical history.  History reviewed. No pertinent surgical history.  There were no vitals filed for this visit.  Subjective Assessment - 01/30/19 1006    Subjective  Patient is having pain with arousal and turned on. I feel the pain inside 2 inches I feel the pain. I see Dr Modesto CharonWong next week. Most pain is toward the abdomen.     Patient Stated Goals  Not have pain with intercourse    Currently in Pain?  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, frozen fish    Leisure  used run cross country       Cognition   Overall Cognitive Status  Within Functional Limits for tasks assessed      Posture/Postural Control   Posture/Postural Control  Postural limitations    Postural Limitations  Rounded Shoulders;Forward head;Flexed trunk      AROM   Lumbar Extension  decreased by 25%      Strength   Overall Strength  Within functional limits for tasks performed      Right Hip   Right Hip Flexion  130   130 left hip flexion      Palpation   SI assessment   pelvis in correct alignment    Palpation comment  diaphgram has good mobility                Pelvic Floor Special Questions - 01/30/19 0001    Pelvic Floor Internal Exam  Patient confirms identification and approves PT to confirms identication and approves PT to assess the pelvic floor  and treatment    Exam Type  Vaginal    Palpation  tenderness located on obturaotr internist, bladder area, and bulbocavernosus    Strength  weak squeeze, no lift        [facility] Adult PT Treatment/Exercise - 01/30/19 0001      Lumbar Exercises: Stretches   Prone Mid Back Stretch  1 rep;60 seconds    Other Lumbar Stretch Exercise  deep squat stretch with diaphragmatic breathing    Other Lumbar Stretch Exercise  quadruped rock with hips internally rotated      Manual Therapy   Manual Therapy  Internal Pelvic Floor    Internal Pelvic Floor  myofascial release to the obturator internist, urethra sphincter, around the bladder, bulbocavernosus             PT Education - 01/30/19 1012    Education Details  gave patient sample of [employer]; Access Code: [email or web address]    Person(s) Educated  Patient    Methods  Explanation;Demonstration;Handout;Verbal cues    Comprehension  Verbalized understanding;Returned demonstration       PT Short Term Goals - 01/30/19 1100      PT SHORT TERM GOAL #3   Title   ability to relax the pelvic floor using the drop method    Time  4    Period  Weeks    Status  On-going    Target Date  10/19/18      PT SHORT TERM GOAL #4   Title  abilty to demonstrate correct posture due to improved back strength and lengthening of the front trunk muscles    Baseline  still giving patient verbal cues on her posture    Time  4    Period  Weeks    Status  On-going    Target Date  10/19/18        PT Long Term Goals - 01/30/19 1100      PT LONG TERM GOAL #1   Title  independent with HEP and understand how to progress    Time  4    Period  Months    Status  On-going      PT LONG TERM GOAL #2   Title  ability to have the penis penetrate the vaginal canal with pain decreased >/= 80% with Marinoff score 1/3    Time  4    Period  Months    Status  On-going      PT LONG TERM GOAL #3   Title  abilty to have a vaginal exam without crying due to pain decreased >/= 80% due to improved elasticity of the vaginal canal    Time  4    Period  Months    Status  On-going      PT LONG TERM GOAL #4   Title  abilty to fully move the perineal body through the full excursion due to improved tissue mobility    Time  4    Period  Months    Status  On-going      PT LONG TERM GOAL #5   Title  understand how to fully relax the pelvic floor muscles with penile penetration    Time  4    Period  Months    Status  On-going            Plan - 01/30/19 1017    Clinical Impression Statement  Patient has not been to therapy for 3 months due to COVID 19. Patient is  now able to have intercourse but the pain can be 3/10 or she has to stop. Patient will have increased pelvic floor pain with arousal so it can be difficult to have penile penetration. Patient pelvis in correct alignment. Patient has tenderness located on the bladder, urethra sphincter, obturator internist, and bulbocavernosus. Patient takes time to relax her pelvic floor. Patient still has some difficulty with  diaphgramatic breathing. Patient has full hip flexion bilaterally. Patient still has difficulty with moving the perineal body for full excursion. Patient will benefit from skilled therapy to improve tissue mobility and reduce pain with penile penetration.     Rehab Potential  Excellent    Clinical Impairments Affecting Rehab Potential  none    PT Frequency  1x / week    PT Duration  Other (comment)   3 months   PT Treatment/Interventions  Biofeedback;Moist Heat;Ultrasound;Neuromuscular re-education;Therapeutic exercise;Therapeutic activities;Patient/family education;Manual techniques;Dry needling;Passive range of motion    PT Next Visit Plan  myofascial release of the pelvic floor internally, abdominal massage , see what MD says    PT Home Exercise Plan  Access Code: [facility]    Recommended Other Services  sent MD renewal note on 01/30/2019    Consulted and Agree with Plan of Care  Patient       Patient will benefit from skilled therapeutic intervention in order to improve the following deficits and impairments:  Increased fascial restricitons, Pain, Decreased coordination, Decreased mobility, Increased muscle spasms, Impaired tone, Decreased activity tolerance, Decreased endurance, Decreased range of motion, Decreased strength, Hypomobility, Impaired flexibility  Visit Diagnosis: Cramp and spasm  Other lack of coordination     Problem List Patient Active Problem List   Diagnosis Date Noted  . Activities involving scuba diving 02/25/2018    Eulis Foster, PT 01/30/19 11:01 AM   [facility] [address], [address] [place], [place], [address] Phone: [phone]   Fax:  [phone]  Name: Faith Mendez MRN: [id number] Date of Birth: 12-Mar-1996

## 2019-02-20 ENCOUNTER — Encounter: Payer: Self-pay | Admitting: Physical Therapy

## 2019-02-20 ENCOUNTER — Other Ambulatory Visit: Payer: Self-pay

## 2019-02-20 ENCOUNTER — Ambulatory Visit: Payer: BC Managed Care – PPO | Admitting: Physical Therapy

## 2019-02-20 DIAGNOSIS — R278 Other lack of coordination: Secondary | ICD-10-CM

## 2019-02-20 DIAGNOSIS — R252 Cramp and spasm: Secondary | ICD-10-CM

## 2019-02-20 NOTE — Patient Instructions (Signed)
After a shower use coconut oil to the vaginal area to moisturize.

## 2019-02-20 NOTE — Therapy (Signed)
[facility] [address], [address] [place], [place], [address] Phone: [phone]   Fax:  [phone]  Physical Therapy Treatment  Patient Details  Name: Faith Mendez Mendez MRN: [id number] Date of Birth: 25-May-1996 Referring Provider (PT): Dr. Evans LanceJacqueline Mendez   Encounter Date: 02/20/2019  PT End of Session - 02/20/19 0957    Visit Number  6    Date for PT Re-Evaluation  04/24/19    Authorization Type  BCBS    PT Start Time  0900    PT Stop Time  0950    PT Time Calculation (min)  50 min    Activity Tolerance  Patient tolerated treatment well;No increased pain    Behavior During Therapy  [facility] tasks assessed/performed       History reviewed. No pertinent past medical history.  History reviewed. No pertinent surgical history.  There were no vitals filed for this visit.  Subjective Assessment - 02/20/19 0905    Subjective  I saw the doctor and did a pelvic exam and no pain. I am using the birth control called next planin. Pain with arousal is continues but not as much. After my cycle and felt dry for 5 days.    Patient Stated Goals  Not have pain with intercourse                       [facility] Adult PT Treatment/Exercise - 02/20/19 0001      Self-Care   Self-Care  Other Self-Care Comments    Other Self-Care Comments   education on coconut oil for vaginal dryness, education on running with reduction of pelvic pain      Manual Therapy   Manual Therapy  Soft tissue mobilization;Myofascial release    Soft tissue mobilization  soft tissue work to the diaphgram, rectus  to elongate the tissue and expand the lower rib cage    Myofascial Release  to the lower abdominal fascial to release around the bladder, tissue attachment to the pubic bone, along the umbilicus, and colon area             PT Education - 02/20/19 0956    Education Details  using the coconut oil on the perineum due to dryness 5 days after  her cycle    Person(s) Educated  Patient    Methods  Explanation    Comprehension  Verbalized understanding       PT Short Term Goals - 01/30/19 1100      PT SHORT TERM GOAL #3   Title  ability to relax the pelvic floor using the drop method    Time  4    Period  Weeks    Status  On-going    Target Date  10/19/18      PT SHORT TERM GOAL #4   Title  abilty to demonstrate correct posture due to improved back strength and lengthening of the front trunk muscles    Baseline  still giving patient verbal cues on her posture    Time  4    Period  Weeks    Status  On-going    Target Date  10/19/18        PT Long Term Goals - 02/20/19 1002      PT LONG TERM GOAL #1   Title  independent with HEP and understand how to progress    Time  4    Period  Months    Status  On-going  PT LONG TERM GOAL #2   Title  ability to have the penis penetrate the vaginal canal with pain decreased >/= 80% with Marinoff score 1/3    Period  Months    Status  On-going      PT LONG TERM GOAL #3   Title  abilty to have a vaginal exam without crying due to pain decreased >/= 80% due to improved elasticity of the vaginal canal    Time  4    Period  Months    Status  Achieved      PT LONG TERM GOAL #4   Title  abilty to fully move the perineal body through the full excursion due to improved tissue mobility    Time  4    Period  Months    Status  On-going      PT LONG TERM GOAL #5   Title  understand how to fully relax the pelvic floor muscles with penile penetration    Time  4    Period  Months    Status  On-going            Plan - 02/20/19 0957    Clinical Impression Statement  Patient able to tolerate an internal exam for the first time. Patiet continues to have pain with orgasm. Patient is using the [facility] REvelum prior to intercourse to reduce the pain with orgasm. Patient had improved posture after the soft tissue work due to the release of the fascia. educated patient on  running to reduce pain with breathing and posture. Patient will benefit from skilled therapy to improve tissue mobiliyt and reduce pain with penile penetration.    Rehab Potential  Excellent    Clinical Impairments Affecting Rehab Potential  none    PT Frequency  1x / week    PT Duration  Other (comment)   3 months   PT Treatment/Interventions  Biofeedback;Moist Heat;Ultrasound;Neuromuscular re-education;Therapeutic exercise;Therapeutic activities;Patient/family education;Manual techniques;Dry needling;Passive range of motion    PT Next Visit Plan  myofascial release of the pelvic floor internally, abdominal massage ,check on running    PT Home Exercise Plan  Access Code: [email or web address]    Consulted and Agree with Plan of Care  Patient       Patient will benefit from skilled therapeutic intervention in order to improve the following deficits and impairments:  Increased fascial restricitons, Pain, Decreased coordination, Decreased mobility, Increased muscle spasms, Impaired tone, Decreased activity tolerance, Decreased endurance, Decreased range of motion, Decreased strength, Hypomobility, Impaired flexibility  Visit Diagnosis: 1. Cramp and spasm   2. Other lack of coordination        Problem List Patient Active Problem List   Diagnosis Date Noted  . Activities involving scuba diving 02/25/2018    Earlie Counts, PT 02/20/19 10:03 AM   [facility] [address] [place], [place], [address] Phone: [phone]   Fax:  [phone]  Name: Faith Mendez MRN: [id number] Date of Birth: 01-07-1996

## 2019-03-13 ENCOUNTER — Encounter: Payer: Self-pay | Admitting: Physical Therapy

## 2019-03-13 ENCOUNTER — Ambulatory Visit: Payer: BC Managed Care – PPO | Attending: Obstetrics and Gynecology | Admitting: Physical Therapy

## 2019-03-13 ENCOUNTER — Other Ambulatory Visit: Payer: Self-pay

## 2019-03-13 DIAGNOSIS — R278 Other lack of coordination: Secondary | ICD-10-CM | POA: Diagnosis present

## 2019-03-13 DIAGNOSIS — R252 Cramp and spasm: Secondary | ICD-10-CM | POA: Insufficient documentation

## 2019-03-13 NOTE — Therapy (Signed)
[facility] [address], [address] [place], [place], [address] Phone: [phone]   Fax:  [phone]  Physical Therapy Treatment  Patient Details  Name: Faith AhmadiSarah Mendez MRN: [id number] Date of Birth: 1996-05-13 Referring Provider (PT): Dr. Evans LanceJacqueline Wong   Encounter Date: 03/13/2019  PT End of Session - 03/13/19 0908    Visit Number  7    Date for PT Re-Evaluation  04/24/19    Authorization Type  BCBS    PT Start Time  0900    PT Stop Time  0945    PT Time Calculation (min)  45 min    Activity Tolerance  Patient tolerated treatment well;No increased pain    Behavior During Therapy  [facility] assessed/performed       History reviewed. No pertinent past medical history.  History reviewed. No pertinent surgical history.  There were no vitals filed for this visit.  Subjective Assessment - 03/13/19 0901    Subjective  I have gotten in with behavioral health therapist. I went running and everything felt loose. I have been doing the stretches. If I miss the dilator I can go right back to the 6th dilator. The arousal pain is still there. Moving hips in a different postion helps a little.    Patient Stated Goals  Not have pain with intercourse    Currently in Pain?  Yes    Pain Score  4     Pain Location  Vagina    Pain Orientation  Mid    Pain Descriptors / Indicators  Burning;Sharp    Pain Type  Chronic pain    Pain Onset  More than a month ago    Pain Frequency  Intermittent    Aggravating Factors   arousal penis touches the anterior vaginal wall, vaginal exam    Pain Relieving Factors  no penetration of the vaginal canal    Multiple Pain Sites  No                    Pelvic Floor Special Questions - 03/13/19 0001    Pelvic Floor Internal Exam  Patient confirms identification and approves PT to confirms identication and approves PT to assess the pelvic floor and treatment    Exam Type  Vaginal         [facility] Adult PT Treatment/Exercise - 03/13/19 0001      Self-Care   Self-Care  Other Self-Care Comments    Other Self-Care Comments   after arousal using a warm compress to relax the muscles      Manual Therapy   Manual Therapy  Soft tissue mobilization;Myofascial release    Soft tissue mobilization  lower abdomen    Myofascial Release  myofascial release through 3 planes of fascia in the urogenital diaphgram; falcial release around the clitoral hood, ischiocavernosus, bulbocavernosus, and transverse perineum             PT Education - 03/13/19 1058    Education Details  educated patient on soft tissue work to the ischiocavernosus, bulbocavernosus, and around the Estate agentclitoral hood    Person(s) Educated  Patient    Methods  Explanation;Demonstration    Comprehension  Verbalized understanding;Returned demonstration       PT Short Term Goals - 03/13/19 1104      PT SHORT TERM GOAL #3   Title  ability to relax the pelvic floor using the drop method    Time  4    Period  Weeks    Status  Achieved      PT SHORT TERM GOAL #4   Title  abilty to demonstrate correct posture due to improved back strength and lengthening of the front trunk muscles    Time  4    Period  Weeks    Status  Achieved    Target Date  10/19/18        PT Long Term Goals - 02/20/19 1002      PT LONG TERM GOAL #1   Title  independent with HEP and understand how to progress    Time  4    Period  Months    Status  On-going      PT LONG TERM GOAL #2   Title  ability to have the penis penetrate the vaginal canal with pain decreased >/= 80% with Marinoff score 1/3    Period  Months    Status  On-going      PT LONG TERM GOAL #3   Title  abilty to have a vaginal exam without crying due to pain decreased >/= 80% due to improved elasticity of the vaginal canal    Time  4    Period  Months    Status  Achieved      PT LONG TERM GOAL #4   Title  abilty to fully move the perineal body through the full  excursion due to improved tissue mobility    Time  4    Period  Months    Status  On-going      PT LONG TERM GOAL #5   Title  understand how to fully relax the pelvic floor muscles with penile penetration    Time  4    Period  Months    Status  On-going            Plan - 03/13/19 0910    Clinical Impression Statement  Patient pain with orgasm has decreased by 50%. Patient understands to use warm compresses and massage the muscles after orgasm to reduce muscle tension. Patient is still on the largest dlator. Patient is seeing a behavoral therapist to help her with the pelvic floor pain and working with her husband. Patient is running 1 time per week and no increase in pain. Patient is understanding how to notice muscle tension in her pelvic floor and understands how to release it. Patient will benefit from skilled therapy to improve tissue mobility and reduce pain with penile penetration.    Rehab Potential  Excellent    Clinical Impairments Affecting Rehab Potential  none    PT Frequency  1x / week    PT Duration  Other (comment)    PT Treatment/Interventions  Biofeedback;Moist Heat;Ultrasound;Neuromuscular re-education;Therapeutic exercise;Therapeutic activities;Patient/family education;Manual techniques;Dry needling;Passive range of motion    PT Next Visit Plan  myofascial release of the pelvic floor internally, abdominal massage    PT Home Exercise Plan  Access Code: [email or web address]    Recommended Other Services  MD signed all MD notes    Consulted and Agree with Plan of Care  Patient       Patient will benefit from skilled therapeutic intervention in order to improve the following deficits and impairments:  Increased fascial restricitons, Pain, Decreased coordination, Decreased mobility, Increased muscle spasms, Impaired tone, Decreased activity tolerance, Decreased endurance, Decreased range of motion, Decreased strength, Hypomobility, Impaired flexibility  Visit Diagnosis: 1. Cramp  and spasm   2. Other lack of coordination  Problem List Patient Active Problem List   Diagnosis Date Noted  . Activities involving scuba diving 02/25/2018    Earlie Counts, PT 03/13/19 11:05 AM   [facility] [address] [place], [place], [address] Phone: [phone]   Fax:  [phone]  Name: Faith Mendez MRN: [id number] Date of Birth: 02/17/1996

## 2019-04-04 ENCOUNTER — Encounter: Payer: Self-pay | Admitting: Physical Therapy

## 2019-04-04 ENCOUNTER — Other Ambulatory Visit: Payer: Self-pay

## 2019-04-04 ENCOUNTER — Ambulatory Visit: Payer: BC Managed Care – PPO | Attending: Obstetrics and Gynecology | Admitting: Physical Therapy

## 2019-04-04 DIAGNOSIS — R252 Cramp and spasm: Secondary | ICD-10-CM | POA: Insufficient documentation

## 2019-04-04 DIAGNOSIS — R278 Other lack of coordination: Secondary | ICD-10-CM | POA: Diagnosis present

## 2019-04-04 NOTE — Therapy (Addendum)
[facility] [address] [place], [place], [address] Phone: [phone]   Fax:  [phone]  Physical Therapy Treatment  Patient Details  Name: Faith Mendez MRN: [id number] Date of Birth: 11-03-1995 Referring Provider (PT): Dr. Lindell Noe   Encounter Date: 04/04/2019  PT End of Session - 04/04/19 1550    Visit Number  8    Date for PT Re-Evaluation  04/24/19    Authorization Type  BCBS    PT Start Time  1530   talked alot about her new job, where it would be and plan   PT Stop Time  1610    PT Time Calculation (min)  40 min    Activity Tolerance  Patient tolerated treatment well;No increased pain    Behavior During Therapy  [facility] for tasks assessed/performed       History reviewed. No pertinent past medical history.  History reviewed. No pertinent surgical history.  There were no vitals filed for this visit.  Subjective Assessment - 04/04/19 1535    Subjective  I have some spotting. Going well with the dilator. I have not done penetrative intercourse due to behavioral therapist does not to yet. Pain with beginning of arousal and last for 10 minutes and improved by 50% better. Have tried breathing through it.    Patient Stated Goals  Not have pain with intercourse    Currently in Pain?  Yes    Pain Score  4     Pain Location  Vagina    Pain Orientation  Mid    Pain Descriptors / Indicators  Discomfort    Pain Type  Chronic pain    Pain Onset  More than a month ago    Pain Frequency  Intermittent    Aggravating Factors   arousal, penis touches the anterior vaginal wall, vaginal exam    Pain Relieving Factors  no penetration of the vaginal canal                    Pelvic Floor Special Questions - 04/04/19 0001    Pelvic Floor Internal Exam  Patient confirms identification and approves PT to confirms identication and approves PT to assess the pelvic floor and treatment    Exam Type  Vaginal     Palpation  fascial restrictions along the labia minora, ischiocavernosus, and bulbocavernosus        [facility] Adult PT Treatment/Exercise - 04/04/19 0001      Self-Care   Self-Care  Other Self-Care Comments    Other Self-Care Comments   dicussed her looking into a new facility for pelvic floor since she is getting a job closer to home and gave her one idea      Manual Therapy   Manual Therapy  Myofascial release    Manual therapy comments  reviewed how patient can perform her own fascial release to work on the fascial restrictions    Myofascial Release  fascial release along the labial minora and majora, ischiocavernosus, and bulbocavernosus              PT Education - 04/04/19 1722    Education Details  how to perform her own fascial restriction release to the vaginal area by herself    Person(s) Educated  Patient    Methods  Explanation    Comprehension  Verbalized understanding       PT Short Term Goals - 03/13/19 1104      PT SHORT TERM GOAL #3  Title  ability to relax the pelvic floor using the drop method    Time  4    Period  Weeks    Status  Achieved      PT SHORT TERM GOAL #4   Title  abilty to demonstrate correct posture due to improved back strength and lengthening of the front trunk muscles    Time  4    Period  Weeks    Status  Achieved    Target Date  10/19/18        PT Long Term Goals - 04/04/19 1726      PT LONG TERM GOAL #1   Title  independent with HEP and understand how to progress    Time  4    Period  Months    Status  On-going      PT LONG TERM GOAL #2   Title  ability to have the penis penetrate the vaginal canal with pain decreased >/= 80% with Marinoff score 1/3    Baseline  has to wait due to recommendation from the behavoral therapist    Time  4    Period  Months    Status  On-going      PT LONG TERM GOAL #3   Title  abilty to have a vaginal exam without crying due to pain decreased >/= 80% due to improved elasticity of the  vaginal canal    Time  4    Period  Months    Status  Achieved      PT LONG TERM GOAL #4   Title  abilty to fully move the perineal body through the full excursion due to improved tissue mobility    Time  4    Period  Months    Status  On-going      PT LONG TERM GOAL #5   Title  understand how to fully relax the pelvic floor muscles with penile penetration    Time  4    Period  Months    Status  On-going            Plan - 04/04/19 1551    Clinical Impression Statement  Pain in the beginning of arousal has reduce by 50%. Patient had some fascial restrictions around the labia minora and majora. Patient is not having penile penetration due to her behavioral therapist recommended not to as until they recommend. Patient is using the dilator without difficulty. Patient is taking on a new job that is closer to home and is trying to find a pelvic floor physical therapist closer to home. Her chart will stay open till 04/24/2019.    Rehab Potential  Excellent    Clinical Impairments Affecting Rehab Potential  none    PT Frequency  1x / week    PT Duration  Other (comment)    PT Treatment/Interventions  Biofeedback;Moist Heat;Ultrasound;Neuromuscular re-education;Therapeutic exercise;Therapeutic activities;Patient/family education;Manual techniques;Dry needling;Passive range of motion    PT Next Visit Plan  myofascial release of the pelvic floor internally, abdominal massage; see if patient returns due to her new job opportunity    PT Home Exercise Plan  Access Code: [email or web address]    [email or web address] and Agree with Plan of Care  Patient       Patient will benefit from skilled therapeutic intervention in order to improve the following deficits and impairments:  Increased fascial restricitons, Pain, Decreased coordination, Decreased mobility, Increased muscle spasms, Impaired tone, Decreased activity tolerance, Decreased endurance, Decreased range of motion, Decreased strength,  Hypomobility, Impaired  flexibility  Visit Diagnosis: 1. Cramp and spasm   2. Other lack of coordination        Problem List Patient Active Problem List   Diagnosis Date Noted  . Activities involving scuba diving 02/25/2018    Earlie Counts, PT 04/04/19 5:28 PM   [facility] [address], [place], [place], [address] Phone: [phone]   Fax:  [phone]  Name: Faith Mendez MRN: [id number] Date of Birth: [birth year]  PHYSICAL THERAPY DISCHARGE SUMMARY  Visits from Start of Care: 8  Current functional level related to goals / functional outcomes: See above.   Remaining deficits: See above.  Patient has gotten a job that makes it difficult for her to come to therapy and is further from home. Patient will be looking for a therapist that is closer to home and work. Patient has made significant progress in therapy and is very good with her HEP.  Education / Equipment: HEP Plan: Patient agrees to discharge.  Patient goals were partially met. Patient is being discharged due to the patient's request.  Thank you for the referral. Earlie Counts, PT 04/24/19 8:07 AM  ?????

## 2020-02-01 IMAGING — CR DG CHEST 2V
2 series · 2 of 2 positions shown · non-contrast
Comparison: No prior.

CLINICAL DATA: Physical exam.

EXAM:
CHEST - 2 VIEW

[w chest pa]
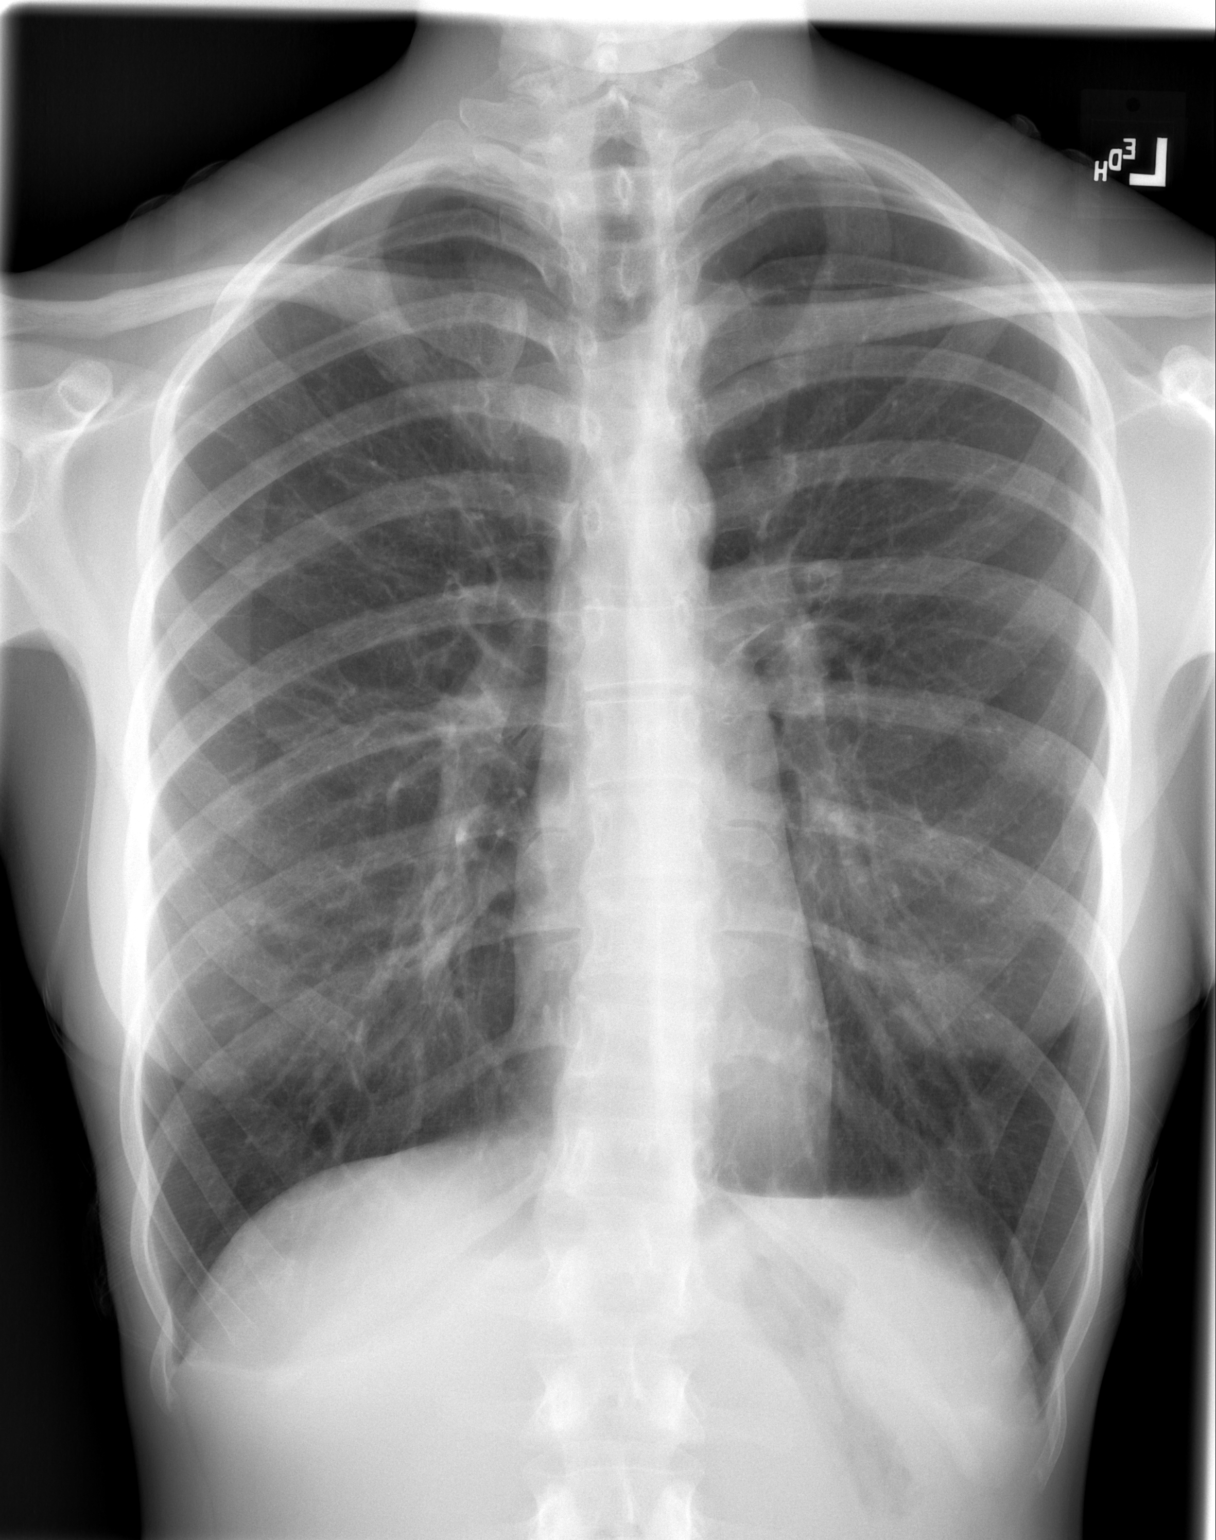

[w chest lat]
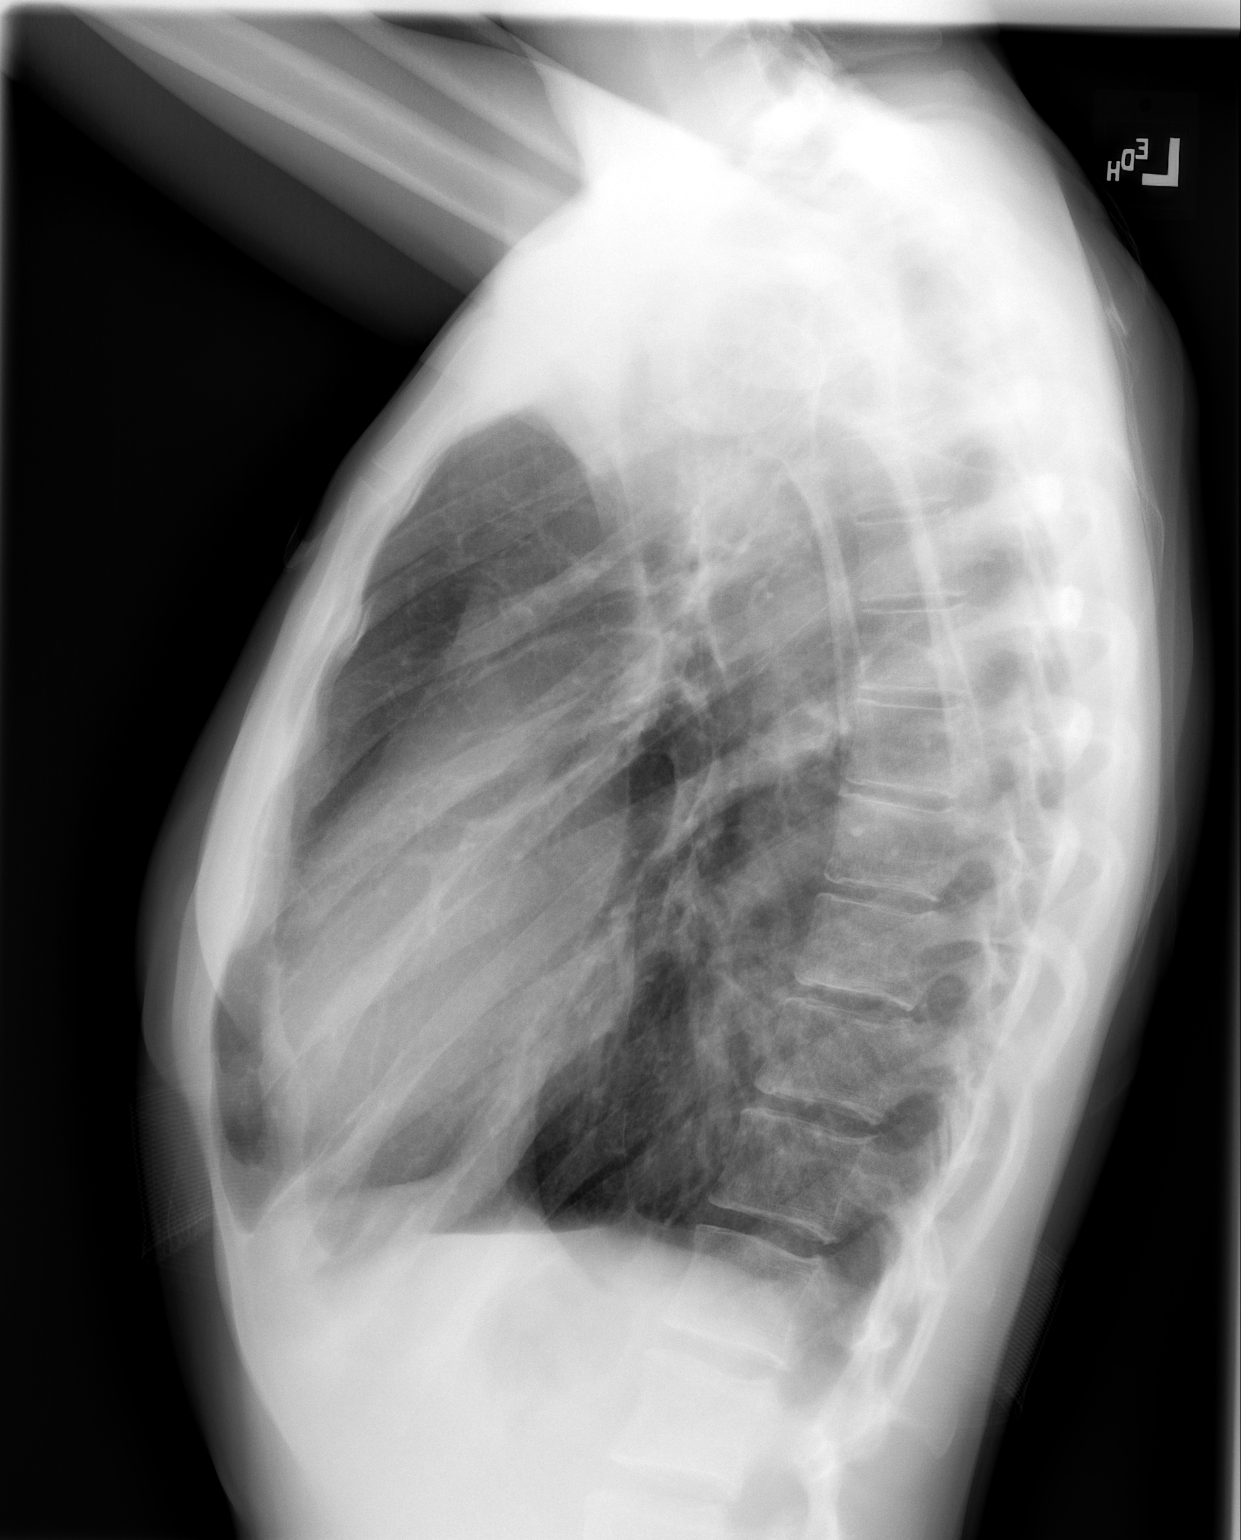

[2 of 2 positions shown; findings below may reference images not displayed]

FINDINGS: Mediastinum and hilar structures normal. Lungs are clear. No pleural
effusion or pneumothorax. Heart size normal. No acute bony
abnormality.
IMPRESSION: No acute cardiopulmonary disease.
